# Patient Record
Sex: Male | Born: 1980 | Race: White | Hispanic: No | Marital: Married | Smoking: Former smoker
Health system: Southern US, Community
[De-identification: ages and names within clinical notes are randomized; demographics above are authoritative.]

---

## 2015-07-06 ENCOUNTER — Emergency Department (HOSPITAL_COMMUNITY)
Admission: EM | Admit: 2015-07-06 | Discharge: 2015-07-06 | Attending: Emergency Medicine | Admitting: Emergency Medicine

## 2015-07-06 ENCOUNTER — Encounter (HOSPITAL_COMMUNITY): Payer: Self-pay | Admitting: Emergency Medicine

## 2015-07-06 ENCOUNTER — Emergency Department (HOSPITAL_COMMUNITY)

## 2015-07-06 DIAGNOSIS — Y999 Unspecified external cause status: Secondary | ICD-10-CM | POA: Insufficient documentation

## 2015-07-06 DIAGNOSIS — Z87891 Personal history of nicotine dependence: Secondary | ICD-10-CM | POA: Diagnosis not present

## 2015-07-06 DIAGNOSIS — S0083XA Contusion of other part of head, initial encounter: Secondary | ICD-10-CM | POA: Insufficient documentation

## 2015-07-06 DIAGNOSIS — Y929 Unspecified place or not applicable: Secondary | ICD-10-CM | POA: Insufficient documentation

## 2015-07-06 DIAGNOSIS — M542 Cervicalgia: Secondary | ICD-10-CM | POA: Insufficient documentation

## 2015-07-06 DIAGNOSIS — Y9339 Activity, other involving climbing, rappelling and jumping off: Secondary | ICD-10-CM | POA: Insufficient documentation

## 2015-07-06 DIAGNOSIS — S0093XA Contusion of unspecified part of head, initial encounter: Secondary | ICD-10-CM

## 2015-07-06 DIAGNOSIS — S0990XA Unspecified injury of head, initial encounter: Secondary | ICD-10-CM | POA: Diagnosis present

## 2015-07-06 MED ORDER — IBUPROFEN 800 MG PO TABS
800.0000 mg | ORAL_TABLET | Freq: Once | ORAL | Status: AC
  Administered 2015-07-06: 800 mg via ORAL
  Filled 2015-07-06: qty 1

## 2015-07-06 MED ORDER — IBUPROFEN 800 MG PO TABS: ORAL_TABLET | ORAL | Status: AC

## 2015-07-06 MED ORDER — TETANUS-DIPHTH-ACELL PERTUSSIS 5-2.5-18.5 LF-MCG/0.5 IM SUSP
0.5000 mL | Freq: Once | INTRAMUSCULAR | Status: AC
  Administered 2015-07-06: 0.5 mL via INTRAMUSCULAR
  Filled 2015-07-06: qty 0.5
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
## 2019-07-07 ENCOUNTER — Emergency Department (HOSPITAL_COMMUNITY)
Admission: EM | Admit: 2019-07-07 | Discharge: 2019-07-07 | Disposition: A | Discharge status: Home / Self Care | Attending: Emergency Medicine | Admitting: Emergency Medicine

## 2019-07-07 ENCOUNTER — Other Ambulatory Visit: Payer: Self-pay

## 2019-07-07 ENCOUNTER — Emergency Department (HOSPITAL_COMMUNITY)

## 2019-07-07 DIAGNOSIS — Z87891 Personal history of nicotine dependence: Secondary | ICD-10-CM | POA: Insufficient documentation

## 2019-07-07 DIAGNOSIS — Y999 Unspecified external cause status: Secondary | ICD-10-CM | POA: Insufficient documentation

## 2019-07-07 DIAGNOSIS — Z20822 Contact with and (suspected) exposure to covid-19: Secondary | ICD-10-CM | POA: Insufficient documentation

## 2019-07-07 DIAGNOSIS — Z23 Encounter for immunization: Secondary | ICD-10-CM | POA: Insufficient documentation

## 2019-07-07 DIAGNOSIS — S71132A Puncture wound without foreign body, left thigh, initial encounter: Secondary | ICD-10-CM | POA: Insufficient documentation

## 2019-07-07 DIAGNOSIS — Y939 Activity, unspecified: Secondary | ICD-10-CM | POA: Insufficient documentation

## 2019-07-07 DIAGNOSIS — W3400XA Accidental discharge from unspecified firearms or gun, initial encounter: Secondary | ICD-10-CM

## 2019-07-07 DIAGNOSIS — R791 Abnormal coagulation profile: Secondary | ICD-10-CM | POA: Insufficient documentation

## 2019-07-07 DIAGNOSIS — Y929 Unspecified place or not applicable: Secondary | ICD-10-CM | POA: Insufficient documentation

## 2019-07-07 LAB — COMPREHENSIVE METABOLIC PANEL
ALT: 134 U/L — ABNORMAL HIGH (ref 0–44)
AST: 60 U/L — ABNORMAL HIGH (ref 15–41)
Albumin: 2.7 g/dL — ABNORMAL LOW (ref 3.5–5.0)
Alkaline Phosphatase: 78 U/L (ref 38–126)
Anion gap: 4 — ABNORMAL LOW (ref 5–15)
BUN: 7 mg/dL (ref 6–20)
CO2: 23 mmol/L (ref 22–32)
Calcium: 7.6 mg/dL — ABNORMAL LOW (ref 8.9–10.3)
Chloride: 110 mmol/L (ref 98–111)
Creatinine, Ser: 1 mg/dL (ref 0.61–1.24)
GFR calc Af Amer: >60 mL/min (ref >60)
GFR calc non Af Amer: >60 mL/min (ref >60)
Glucose, Bld: 133 mg/dL — ABNORMAL HIGH (ref 70–99)
Potassium: 3.6 mmol/L (ref 3.5–5.1)
Sodium: 137 mmol/L (ref 135–145)
Total Bilirubin: 0.3 mg/dL (ref 0.3–1.2)
Total Protein: 5.1 g/dL — ABNORMAL LOW (ref 6.5–8.1)

## 2019-07-07 LAB — SARS CORONAVIRUS 2 BY RT PCR (HOSPITAL ORDER, PERFORMED IN ~~LOC~~ HOSPITAL LAB): SARS Coronavirus 2: NEGATIVE

## 2019-07-07 LAB — CBC
HCT: 33.4 % — ABNORMAL LOW (ref 39.0–52.0)
Hemoglobin: 10.7 g/dL — ABNORMAL LOW (ref 13.0–17.0)
MCH: 29.8 pg (ref 26.0–34.0)
MCHC: 32 g/dL (ref 30.0–36.0)
MCV: 93 fL (ref 80.0–100.0)
Platelets: 194 10*3/uL (ref 150–400)
RBC: 3.59 MIL/uL — ABNORMAL LOW (ref 4.22–5.81)
RDW: 12.3 % (ref 11.5–15.5)
WBC: 11.9 10*3/uL — ABNORMAL HIGH (ref 4.0–10.5)
nRBC: 0 % (ref 0.0–0.2)

## 2019-07-07 LAB — I-STAT CHEM 8, ED
BUN: 7 mg/dL (ref 6–20)
Calcium, Ion: 1.17 mmol/L (ref 1.15–1.40)
Chloride: 105 mmol/L (ref 98–111)
Creatinine, Ser: 0.9 mg/dL (ref 0.61–1.24)
Glucose, Bld: 128 mg/dL — ABNORMAL HIGH (ref 70–99)
HCT: 29 % — ABNORMAL LOW (ref 39.0–52.0)
Hemoglobin: 9.9 g/dL — ABNORMAL LOW (ref 13.0–17.0)
Potassium: 3.5 mmol/L (ref 3.5–5.1)
Sodium: 142 mmol/L (ref 135–145)
TCO2: 24 mmol/L (ref 22–32)

## 2019-07-07 LAB — PROTIME-INR
INR: 1.1 (ref 0.8–1.2)
Prothrombin Time: 13.7 seconds (ref 11.4–15.2)

## 2019-07-07 LAB — ETHANOL: Alcohol, Ethyl (B): <10 mg/dL (ref <10)

## 2019-07-07 LAB — SAMPLE TO BLOOD BANK

## 2019-07-07 MED ORDER — IOHEXOL 350 MG/ML SOLN
100.0000 mL | Freq: Once | INTRAVENOUS | Status: AC | PRN
  Administered 2019-07-07: 100 mL via INTRAVENOUS

## 2019-07-07 MED ORDER — HYDROCODONE-ACETAMINOPHEN 5-325 MG PO TABS
1.0000 | ORAL_TABLET | Freq: Once | ORAL | Status: AC
  Administered 2019-07-07: 1 via ORAL
  Filled 2019-07-07: qty 1

## 2019-07-07 MED ORDER — HYDROCODONE-ACETAMINOPHEN 5-325 MG PO TABS: 1.0000 | ORAL_TABLET | Freq: Four times a day (QID) | ORAL | 0 refills | Status: AC | PRN

## 2019-07-07 MED ORDER — CEPHALEXIN 500 MG PO CAPS: 500.0000 mg | ORAL_CAPSULE | Freq: Four times a day (QID) | ORAL | 0 refills | Status: AC

## 2019-07-07 MED ORDER — CEFAZOLIN SODIUM-DEXTROSE 2-4 GM/100ML-% IV SOLN
2.0000 g | Freq: Once | INTRAVENOUS | Status: AC
  Administered 2019-07-07: 2 g via INTRAVENOUS
  Filled 2019-07-07: qty 100

## 2019-07-07 MED ORDER — TETANUS-DIPHTH-ACELL PERTUSSIS 5-2.5-18.5 LF-MCG/0.5 IM SUSP
0.5000 mL | Freq: Once | INTRAMUSCULAR | Status: AC
  Administered 2019-07-07: 0.5 mL via INTRAMUSCULAR
  Filled 2019-07-07: qty 0.5

## 2019-07-07 MED ORDER — LACTATED RINGERS IV BOLUS
1000.0000 mL | Freq: Once | INTRAVENOUS | Status: AC
  Administered 2019-07-07: 1000 mL via INTRAVENOUS

## 2019-07-07 MED ORDER — FENTANYL CITRATE (PF) 100 MCG/2ML IJ SOLN
50.0000 ug | INTRAMUSCULAR | Status: DC | PRN
  Administered 2019-07-07 (×2): 50 ug via INTRAVENOUS
  Filled 2019-07-07 (×2): qty 2
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
## 2019-07-07 NOTE — Progress Notes (Signed)
Orthopedic Tech Progress Note Patient Details:  Alejandro Simon [birth year] [id number]  Ortho Devices Type of Ortho Device: Crutches Ortho Device/Splint Interventions: Ordered, Application, Adjustment   Post Interventions Patient Tolerated: Well Instructions Provided: Care of device, Adjustment of device   Trinna Post 07/07/2019, 6:49 AM
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
## 2019-07-07 NOTE — ED Notes (Signed)
Pt able to ambulate in hallway. 
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
